# Patient Record
Sex: Female | Born: 1955 | Race: White | Hispanic: No | Marital: Married | Smoking: Never smoker
Health system: Southern US, Community
[De-identification: ages and names within clinical notes are randomized; demographics above are authoritative.]

## PROBLEM LIST (undated history)

## (undated) DIAGNOSIS — K219 Gastro-esophageal reflux disease without esophagitis: Secondary | ICD-10-CM

## (undated) DIAGNOSIS — F419 Anxiety disorder, unspecified: Secondary | ICD-10-CM

## (undated) HISTORY — PX: AUGMENTATION MAMMAPLASTY: SUR837

---

## 1999-11-07 ENCOUNTER — Other Ambulatory Visit: Admission: RE | Admit: 1999-11-07 | Discharge: 1999-11-07 | Payer: Self-pay | Admitting: Obstetrics and Gynecology

## 2001-08-18 ENCOUNTER — Other Ambulatory Visit: Admission: RE | Admit: 2001-08-18 | Discharge: 2001-08-18 | Payer: Self-pay | Admitting: Obstetrics and Gynecology

## 2003-02-04 ENCOUNTER — Other Ambulatory Visit: Admission: RE | Admit: 2003-02-04 | Discharge: 2003-02-04 | Payer: Self-pay | Admitting: *Deleted

## 2004-03-06 ENCOUNTER — Other Ambulatory Visit: Admission: RE | Admit: 2004-03-06 | Discharge: 2004-03-06 | Payer: Self-pay | Admitting: *Deleted

## 2009-09-12 ENCOUNTER — Ambulatory Visit: Payer: Self-pay | Admitting: Radiology

## 2009-09-12 ENCOUNTER — Ambulatory Visit (HOSPITAL_BASED_OUTPATIENT_CLINIC_OR_DEPARTMENT_OTHER): Admission: RE | Admit: 2009-09-12 | Discharge: 2009-09-12 | Payer: Self-pay | Admitting: Obstetrics & Gynecology

## 2010-01-28 ENCOUNTER — Emergency Department (HOSPITAL_BASED_OUTPATIENT_CLINIC_OR_DEPARTMENT_OTHER): Admission: EM | Admit: 2010-01-28 | Discharge: 2010-01-28 | Payer: Self-pay | Admitting: Emergency Medicine

## 2010-01-29 ENCOUNTER — Emergency Department (HOSPITAL_BASED_OUTPATIENT_CLINIC_OR_DEPARTMENT_OTHER): Admission: EM | Admit: 2010-01-29 | Discharge: 2010-01-29 | Payer: Self-pay | Admitting: Emergency Medicine

## 2010-01-29 ENCOUNTER — Ambulatory Visit: Payer: Self-pay | Admitting: Diagnostic Radiology

## 2011-02-18 LAB — URINE CULTURE
Colony Count: >=100000
Colony Count: NO GROWTH

## 2011-02-18 LAB — CBC
HCT: 34.2 % — ABNORMAL LOW (ref 36.0–46.0)
Hemoglobin: 12.1 g/dL (ref 12.0–15.0)
MCV: 90.3 fL (ref 78.0–100.0)

## 2011-02-18 LAB — BASIC METABOLIC PANEL
Calcium: 9.4 mg/dL (ref 8.4–10.5)
Creatinine, Ser: 0.8 mg/dL (ref 0.4–1.2)
GFR calc Af Amer: >60 mL/min (ref >60)
GFR calc non Af Amer: >60 mL/min (ref >60)
Glucose, Bld: 123 mg/dL — ABNORMAL HIGH (ref 70–99)

## 2011-02-18 LAB — URINALYSIS, ROUTINE W REFLEX MICROSCOPIC
Bilirubin Urine: NEGATIVE
Ketones, ur: 15 mg/dL — AB
Protein, ur: 100 mg/dL — AB
Protein, ur: 30 mg/dL — AB
Urobilinogen, UA: 0.2 mg/dL (ref 0.0–1.0)
pH: 6 (ref 5.0–8.0)

## 2011-02-18 LAB — DIFFERENTIAL
Basophils Relative: 0 % (ref 0–1)
Eosinophils Absolute: 0 10*3/uL (ref 0.0–0.7)
Eosinophils Relative: 0 % (ref 0–5)
Lymphocytes Relative: 2 % — ABNORMAL LOW (ref 12–46)
Neutro Abs: 29.6 10*3/uL — ABNORMAL HIGH (ref 1.7–7.7)

## 2011-02-18 LAB — URINE MICROSCOPIC-ADD ON

## 2011-02-28 ENCOUNTER — Other Ambulatory Visit (HOSPITAL_BASED_OUTPATIENT_CLINIC_OR_DEPARTMENT_OTHER): Payer: Self-pay | Admitting: Obstetrics & Gynecology

## 2011-02-28 DIAGNOSIS — Z1231 Encounter for screening mammogram for malignant neoplasm of breast: Secondary | ICD-10-CM

## 2011-03-12 ENCOUNTER — Ambulatory Visit (HOSPITAL_BASED_OUTPATIENT_CLINIC_OR_DEPARTMENT_OTHER)
Admission: RE | Admit: 2011-03-12 | Discharge: 2011-03-12 | Disposition: A | Discharge status: Home / Self Care | Payer: BLUE CROSS/BLUE SHIELD | Source: Ambulatory Visit | Attending: Obstetrics & Gynecology | Admitting: Obstetrics & Gynecology

## 2011-03-12 ENCOUNTER — Other Ambulatory Visit (HOSPITAL_BASED_OUTPATIENT_CLINIC_OR_DEPARTMENT_OTHER): Payer: Self-pay | Admitting: Obstetrics & Gynecology

## 2011-03-12 DIAGNOSIS — Z1231 Encounter for screening mammogram for malignant neoplasm of breast: Secondary | ICD-10-CM

## 2012-05-27 ENCOUNTER — Other Ambulatory Visit (HOSPITAL_BASED_OUTPATIENT_CLINIC_OR_DEPARTMENT_OTHER): Payer: Self-pay | Admitting: Obstetrics & Gynecology

## 2012-05-27 DIAGNOSIS — Z1231 Encounter for screening mammogram for malignant neoplasm of breast: Secondary | ICD-10-CM

## 2012-06-03 ENCOUNTER — Ambulatory Visit (HOSPITAL_BASED_OUTPATIENT_CLINIC_OR_DEPARTMENT_OTHER): Payer: BLUE CROSS/BLUE SHIELD

## 2012-06-03 ENCOUNTER — Inpatient Hospital Stay (HOSPITAL_BASED_OUTPATIENT_CLINIC_OR_DEPARTMENT_OTHER): Admission: RE | Admit: 2012-06-03 | Payer: BLUE CROSS/BLUE SHIELD | Source: Ambulatory Visit

## 2012-06-10 ENCOUNTER — Ambulatory Visit (HOSPITAL_BASED_OUTPATIENT_CLINIC_OR_DEPARTMENT_OTHER): Payer: BLUE CROSS/BLUE SHIELD

## 2012-07-01 ENCOUNTER — Ambulatory Visit (HOSPITAL_BASED_OUTPATIENT_CLINIC_OR_DEPARTMENT_OTHER)
Admission: RE | Admit: 2012-07-01 | Discharge: 2012-07-01 | Disposition: A | Discharge status: Home / Self Care | Payer: No Typology Code available for payment source | Source: Ambulatory Visit | Attending: Obstetrics & Gynecology | Admitting: Obstetrics & Gynecology

## 2012-07-01 ENCOUNTER — Inpatient Hospital Stay (HOSPITAL_BASED_OUTPATIENT_CLINIC_OR_DEPARTMENT_OTHER): Admission: RE | Admit: 2012-07-01 | Payer: BLUE CROSS/BLUE SHIELD | Source: Ambulatory Visit

## 2012-07-01 DIAGNOSIS — Z1231 Encounter for screening mammogram for malignant neoplasm of breast: Secondary | ICD-10-CM | POA: Insufficient documentation

## 2013-12-29 ENCOUNTER — Other Ambulatory Visit (HOSPITAL_BASED_OUTPATIENT_CLINIC_OR_DEPARTMENT_OTHER): Payer: Self-pay | Admitting: Obstetrics & Gynecology

## 2013-12-29 DIAGNOSIS — Z1231 Encounter for screening mammogram for malignant neoplasm of breast: Secondary | ICD-10-CM

## 2014-01-12 ENCOUNTER — Ambulatory Visit (HOSPITAL_BASED_OUTPATIENT_CLINIC_OR_DEPARTMENT_OTHER)
Admission: RE | Admit: 2014-01-12 | Discharge: 2014-01-12 | Disposition: A | Discharge status: Home / Self Care | Payer: BC Managed Care – PPO | Source: Ambulatory Visit | Attending: Obstetrics & Gynecology | Admitting: Obstetrics & Gynecology

## 2014-01-12 DIAGNOSIS — Z1231 Encounter for screening mammogram for malignant neoplasm of breast: Secondary | ICD-10-CM | POA: Insufficient documentation

## 2015-02-09 ENCOUNTER — Emergency Department (HOSPITAL_COMMUNITY): Payer: BLUE CROSS/BLUE SHIELD

## 2015-02-09 ENCOUNTER — Observation Stay (HOSPITAL_COMMUNITY)
Admission: EM | Admit: 2015-02-09 | Discharge: 2015-02-10 | Disposition: A | Discharge status: Home / Self Care | Payer: BLUE CROSS/BLUE SHIELD | Attending: Internal Medicine | Admitting: Internal Medicine

## 2015-02-09 ENCOUNTER — Encounter (HOSPITAL_COMMUNITY): Payer: Self-pay | Admitting: Emergency Medicine

## 2015-02-09 DIAGNOSIS — R9431 Abnormal electrocardiogram [ECG] [EKG]: Secondary | ICD-10-CM | POA: Diagnosis not present

## 2015-02-09 DIAGNOSIS — F329 Major depressive disorder, single episode, unspecified: Secondary | ICD-10-CM | POA: Diagnosis not present

## 2015-02-09 DIAGNOSIS — E785 Hyperlipidemia, unspecified: Secondary | ICD-10-CM | POA: Diagnosis not present

## 2015-02-09 DIAGNOSIS — K219 Gastro-esophageal reflux disease without esophagitis: Secondary | ICD-10-CM | POA: Diagnosis not present

## 2015-02-09 DIAGNOSIS — R030 Elevated blood-pressure reading, without diagnosis of hypertension: Secondary | ICD-10-CM | POA: Diagnosis not present

## 2015-02-09 DIAGNOSIS — Z79899 Other long term (current) drug therapy: Secondary | ICD-10-CM | POA: Diagnosis not present

## 2015-02-09 DIAGNOSIS — R202 Paresthesia of skin: Secondary | ICD-10-CM | POA: Insufficient documentation

## 2015-02-09 DIAGNOSIS — R200 Anesthesia of skin: Secondary | ICD-10-CM | POA: Diagnosis present

## 2015-02-09 DIAGNOSIS — F419 Anxiety disorder, unspecified: Secondary | ICD-10-CM | POA: Diagnosis not present

## 2015-02-09 DIAGNOSIS — R079 Chest pain, unspecified: Principal | ICD-10-CM | POA: Diagnosis present

## 2015-02-09 DIAGNOSIS — IMO0001 Reserved for inherently not codable concepts without codable children: Secondary | ICD-10-CM

## 2015-02-09 HISTORY — DX: Gastro-esophageal reflux disease without esophagitis: K21.9

## 2015-02-09 HISTORY — DX: Anxiety disorder, unspecified: F41.9

## 2015-02-09 LAB — BASIC METABOLIC PANEL
ANION GAP: 9 (ref 5–15)
BUN: 10 mg/dL (ref 6–23)
CHLORIDE: 105 mmol/L (ref 96–112)
CO2: 26 mmol/L (ref 19–32)
Calcium: 9.4 mg/dL (ref 8.4–10.5)
Creatinine, Ser: 0.72 mg/dL (ref 0.50–1.10)
GFR calc Af Amer: >90 mL/min (ref >90)
GLUCOSE: 92 mg/dL (ref 70–99)
POTASSIUM: 3.7 mmol/L (ref 3.5–5.1)
SODIUM: 140 mmol/L (ref 135–145)

## 2015-02-09 LAB — CBC
HEMATOCRIT: 39 % (ref 36.0–46.0)
Hemoglobin: 12.9 g/dL (ref 12.0–15.0)
MCH: 30 pg (ref 26.0–34.0)
MCHC: 33.1 g/dL (ref 30.0–36.0)
MCV: 90.7 fL (ref 78.0–100.0)
Platelets: 249 10*3/uL (ref 150–400)
RBC: 4.3 MIL/uL (ref 3.87–5.11)
RDW: 12.2 % (ref 11.5–15.5)
WBC: 7 10*3/uL (ref 4.0–10.5)

## 2015-02-09 LAB — I-STAT TROPONIN, ED: TROPONIN I, POC: 0 ng/mL (ref 0.00–0.08)

## 2015-02-09 MED ORDER — NITROGLYCERIN 0.4 MG SL SUBL
0.4000 mg | SUBLINGUAL_TABLET | SUBLINGUAL | Status: DC | PRN
  Administered 2015-02-09: 0.4 mg via SUBLINGUAL
  Filled 2015-02-09: qty 1

## 2015-02-09 MED ORDER — ASPIRIN 81 MG PO CHEW
324.0000 mg | CHEWABLE_TABLET | Freq: Once | ORAL | Status: AC
  Administered 2015-02-09: 324 mg via ORAL
  Filled 2015-02-09: qty 4

## 2015-02-09 MED ORDER — MORPHINE SULFATE 4 MG/ML IJ SOLN
4.0000 mg | Freq: Once | INTRAMUSCULAR | Status: DC
  Filled 2015-02-09: qty 1

## 2015-02-09 MED ORDER — GI COCKTAIL ~~LOC~~
30.0000 mL | Freq: Once | ORAL | Status: AC
  Administered 2015-02-09: 30 mL via ORAL
  Filled 2015-02-09: qty 30

## 2015-02-09 NOTE — ED Provider Notes (Signed)
CSN: [id number]     Arrival date & time 02/09/15  1307 History   First MD Initiated Contact with Patient 02/09/15 1727     Chief Complaint  Patient presents with  . Chest Pain  . Anxiety     (Consider location/radiation/quality/duration/timing/severity/associated sxs/prior Treatment) Patient is a 59 y.o. female presenting with chest pain. The history is provided by the patient.  Chest Pain Pain location:  Substernal area Pain quality: pressure   Pain radiates to:  L arm Pain radiates to the back: no   Pain severity:  Moderate Onset quality:  Gradual Duration:  3 days Timing:  Intermittent Progression:  Worsening Chronicity:  New Context: at rest   Relieved by:  Nothing Exacerbated by: emotional upset. Associated symptoms: no altered mental status, no back pain, no cough, no fever, no nausea, no shortness of breath and not vomiting     Past Medical History  Diagnosis Date  . GERD (gastroesophageal reflux disease)   . Anxiety    History reviewed. No pertinent past surgical history. History reviewed. No pertinent family history. History  Substance Use Topics  . Smoking status: Never Smoker   . Smokeless tobacco: Not on file  . Alcohol Use: 0.6 oz/week    1 Glasses of wine per week   OB History    No data available     Review of Systems  Constitutional: Negative for fever.  Respiratory: Negative for cough and shortness of breath.   Cardiovascular: Positive for chest pain.  Gastrointestinal: Negative for nausea and vomiting.  Musculoskeletal: Negative for back pain.  All other systems reviewed and are negative.     Allergies  Sulfa antibiotics  Home Medications   Prior to Admission medications   Medication Sig Start Date End Date Taking? Authorizing Provider  atorvastatin (LIPITOR) 20 MG tablet Take 20 mg by mouth daily. 11/29/14  Yes Historical Provider, MD  Cholecalciferol (VITAMIN D-3 PO) Take by mouth.   Yes Historical Provider, MD  escitalopram  (LEXAPRO) 10 MG tablet Take 10 mg by mouth daily. 01/26/15  Yes Historical Provider, MD  EVENING PRIMROSE OIL PO Take by mouth.   Yes Historical Provider, MD  OVER THE COUNTER MEDICATION "Thyroid supplement."   Yes Historical Provider, MD  OVER THE COUNTER MEDICATION "Hormone cream."   Yes Historical Provider, MD  OVER THE COUNTER MEDICATION "Acid reflux supplement."   Yes Historical Provider, MD  PANTOTHENIC ACID PO Take by mouth. "Vitamin B-5."   Yes Historical Provider, MD  pantoprazole (PROTONIX) 40 MG tablet Take 1 tablet (40 mg total) by mouth daily. 02/10/15   Alison Murray, MD   BP 125/75 mmHg  Pulse 56  Temp(Src) 98 F (36.7 C) (Oral)  Resp 18  Ht  (1.575 m)  Wt 117 lb (53.071 kg)  BMI 21.39 kg/m2  SpO2 98% Physical Exam  Constitutional: She is oriented to person, place, and time. She appears well-developed and well-nourished. No distress.  HENT:  Head: Normocephalic and atraumatic.  Mouth/Throat: Oropharynx is clear and moist.  Eyes: EOM are normal. Pupils are equal, round, and reactive to light.  Neck: Normal range of motion. Neck supple.  Cardiovascular: Normal rate and regular rhythm.  Exam reveals no friction rub.   No murmur heard. Pulmonary/Chest: Effort normal and breath sounds normal. No respiratory distress. She has no wheezes. She has no rales.  Abdominal: Soft. She exhibits no distension. There is no tenderness. There is no rebound.  Musculoskeletal: Normal range of motion. She exhibits no edema.  Neurological: She is alert and oriented to person, place, and time.  Skin: No rash noted. She is not diaphoretic.  Nursing note and vitals reviewed.   ED Course  Procedures (including critical care time) Labs Review Labs Reviewed  CBC  BASIC METABOLIC PANEL  LIPID PANEL  TROPONIN I  TSH  VITAMIN B12  TROPONIN I  CBC  CREATININE, SERUM  I-STAT TROPOININ, ED    Imaging Review Dg Chest 2 View (if Patient Has Fever And/or Copd)  02/09/2015   CLINICAL  DATA:  Worsening tightness in chest for 3 days, history GERD  EXAM: CHEST  2 VIEW  COMPARISON:  04/26/2012  FINDINGS: Normal heart size, mediastinal contours, and pulmonary vascularity.  Lungs hyperinflated but clear.  Minimal central peribronchial thickening.  No infiltrate, pleural effusion or pneumothorax.  BILATERAL breast prostheses.  Bones demineralized.  IMPRESSION: Hyperinflated lungs with minimal peribronchial thickening.  No acute infiltrate.   Electronically Signed   By: Ulyses SouthwardMark  Boles M.D.   On: 02/09/2015 14:14    EKG Interpretation  Date/Time:  Thursday February 09 2015 18:06:58 EDT Ventricular Rate:  62 PR Interval:  140 QRS Duration: 82 QT Interval:  467 QTC Calculation: 474 R Axis:   56 Text Interpretation:  Sinus rhythm Borderline T abnormalities, anterior leads Flipped T waves V1-V2 Confirmed by Gwendolyn GrantWALDEN  MD, Ragena Fiola ([id number]) on 02/09/2015 6:10:10 PM        EKG Interpretation   Date/Time:  Thursday February 09 2015 13:15:52 EDT Ventricular Rate:  80 PR Interval:  136 QRS Duration: 78 QT Interval:  405 QTC Calculation: 467 R Axis:   59 Text Interpretation:  Sinus rhythm Anteroseptal infarct, age indeterminate  Baseline wander in lead(s) I V1 No prior for comparison Confirmed by  Gwendolyn GrantWALDEN  MD, Ellinore Merced ([id number]) on 02/09/2015 5:28:01 PM      MDM   Final diagnoses:  Chest pain, unspecified chest pain type    32F presents with chest tightness, discomfort for the past 3 days. Intermittent. Pressure like, radiates to her L arm. Worse with emotional upset, no alleviating factors. Initially began as a epigastric burning sensation, however changed into more of a pressure like pain. Happens for as long as hours, alleviates on its own.  AFVSS here. Has 1-2/10 chest pressure. Initial EKG with some nonspecific changes in V1-V2, will repeat. ASA and NTG given. Initial troponin normal. Will give GI cocktail. Pain without improvement, still mildly present. With flipped T waves, will admit. I  spoke with Cards on-call, recommended hospitalist admission since she's low-risk. I agree with this plan. Admitted to hospitalist. Repeat EKG with flipped T waves anteriorly in V1-V2, better visualized on 2nd EKG.  Elwin MochaBlair Kemora Pinard, MD 02/10/15 1236

## 2015-02-09 NOTE — ED Notes (Signed)
Pt c/o chest tightness and discomfort in central chest. Pt c/o acid reflux x 3 days that won't go away, pt states she feels she is having bad anxiety attack. Pt denies SOB.

## 2015-02-10 DIAGNOSIS — F419 Anxiety disorder, unspecified: Secondary | ICD-10-CM | POA: Diagnosis not present

## 2015-02-10 DIAGNOSIS — R0789 Other chest pain: Secondary | ICD-10-CM

## 2015-02-10 DIAGNOSIS — K219 Gastro-esophageal reflux disease without esophagitis: Secondary | ICD-10-CM | POA: Diagnosis not present

## 2015-02-10 DIAGNOSIS — R079 Chest pain, unspecified: Secondary | ICD-10-CM | POA: Diagnosis not present

## 2015-02-10 DIAGNOSIS — E785 Hyperlipidemia, unspecified: Secondary | ICD-10-CM | POA: Diagnosis not present

## 2015-02-10 LAB — LIPID PANEL
CHOLESTEROL: 155 mg/dL (ref 0–200)
HDL: 46 mg/dL (ref >39)
LDL Cholesterol: 95 mg/dL (ref 0–99)
Total CHOL/HDL Ratio: 3.4 RATIO
Triglycerides: 69 mg/dL (ref <150)
VLDL: 14 mg/dL (ref 0–40)

## 2015-02-10 LAB — TROPONIN I

## 2015-02-10 MED ORDER — HEPARIN SODIUM (PORCINE) 5000 UNIT/ML IJ SOLN
5000.0000 [IU] | Freq: Three times a day (TID) | INTRAMUSCULAR | Status: DC
  Administered 2015-02-10: 5000 [IU] via SUBCUTANEOUS
  Filled 2015-02-10: qty 1

## 2015-02-10 MED ORDER — GI COCKTAIL ~~LOC~~
30.0000 mL | Freq: Four times a day (QID) | ORAL | Status: DC | PRN
  Administered 2015-02-10: 30 mL via ORAL
  Filled 2015-02-10: qty 30

## 2015-02-10 MED ORDER — ACETAMINOPHEN 325 MG PO TABS: 650.0000 mg | ORAL_TABLET | ORAL | Status: DC | PRN

## 2015-02-10 MED ORDER — ALPRAZOLAM 0.25 MG PO TABS: 0.2500 mg | ORAL_TABLET | Freq: Two times a day (BID) | ORAL | Status: DC | PRN

## 2015-02-10 MED ORDER — ESCITALOPRAM OXALATE 10 MG PO TABS
10.0000 mg | ORAL_TABLET | Freq: Every day | ORAL | Status: DC
Start: 2015-02-10 — End: 2015-02-10
  Administered 2015-02-10: 10 mg via ORAL
  Filled 2015-02-10: qty 1

## 2015-02-10 MED ORDER — METOPROLOL TARTRATE 25 MG PO TABS
12.5000 mg | ORAL_TABLET | Freq: Two times a day (BID) | ORAL | Status: DC
  Filled 2015-02-10: qty 1

## 2015-02-10 MED ORDER — SODIUM CHLORIDE 0.9 % IV SOLN: INTRAVENOUS | Status: DC

## 2015-02-10 MED ORDER — PANTOPRAZOLE SODIUM 40 MG PO TBEC
40.0000 mg | DELAYED_RELEASE_TABLET | Freq: Two times a day (BID) | ORAL | Status: DC
  Administered 2015-02-10: 40 mg via ORAL
  Filled 2015-02-10: qty 1

## 2015-02-10 MED ORDER — PANTOPRAZOLE SODIUM 40 MG PO TBEC: 40.0000 mg | DELAYED_RELEASE_TABLET | Freq: Every day | ORAL | Status: DC

## 2015-02-10 MED ORDER — MORPHINE SULFATE 2 MG/ML IJ SOLN: 2.0000 mg | INTRAMUSCULAR | Status: DC | PRN

## 2015-02-10 MED ORDER — ASPIRIN EC 81 MG PO TBEC
81.0000 mg | DELAYED_RELEASE_TABLET | Freq: Every day | ORAL | Status: DC
  Administered 2015-02-10: 81 mg via ORAL
  Filled 2015-02-10: qty 1

## 2015-02-10 MED ORDER — ATORVASTATIN CALCIUM 10 MG PO TABS: 20.0000 mg | ORAL_TABLET | Freq: Every day | ORAL | Status: DC

## 2015-02-10 MED ORDER — SUCRALFATE 1 GM/10ML PO SUSP
1.0000 g | Freq: Three times a day (TID) | ORAL | Status: DC
  Administered 2015-02-10: 1 g via ORAL
  Filled 2015-02-10: qty 10

## 2015-02-10 MED ORDER — ONDANSETRON HCL 4 MG/2ML IJ SOLN: 4.0000 mg | Freq: Four times a day (QID) | INTRAMUSCULAR | Status: DC | PRN

## 2015-02-10 NOTE — Discharge Summary (Addendum)
Physician Discharge Summary  Janet James ZOX:[id number] DOB: [birth year] DOA: 02/09/2015  PCP: No primary care provider on file.  Admit date: 02/09/2015 Discharge date: 02/10/2015  Recommendations for Outpatient Follow-up:  1. Follow-up with primary care physician per scheduled appointment. 2.   New medication is Protonix 40 mg daily.  Discharge Diagnoses:  Active Problems:   Chest pain   GERD (gastroesophageal reflux disease)   Anxiety   Numbness and tingling in left hand   HLD (hyperlipidemia)   Elevated BP    Discharge Condition: stable   Diet recommendation: as tolerated   History of present illness:  59 year old female with past medical history of acid reflux, dyslipidemia who presented to [facility] for evaluation of intermittent chest pain, feeling like burning sensation especially after eating. Patient reports the burning sensation sometimes radiates up into the throat area. No shortness of breath or palpitations. No abdominal pain, nausea or vomiting. Patient takes over-the-counter acid reflux medication. This has not provided significant symptomatic relief.  On the admission, 12-lead EKG showed sinus rhythm. Troponin level was within normal limits. Unremarkable blood work.   Hospital Course:   Active Problems:   Chest pain - Likely of GI etiology, acid reflux. - Patient feels better this morning. We will continue Protonix on discharge. - 12-lead EKG showed sinus rhythm. The troponin levels were within normal limits. - Chest x-ray no acute findings.    GERD (gastroesophageal reflux disease) - As mentioned, continue Protonix on discharge    HLD (hyperlipidemia) - Continue Lipitor on discharge as per prior home regimen    Anxiety - Continue home medications  Signed:  Manson Passey, MD  [facility] 02/10/2015, 9:21 AM  Pager #: [phone]  Procedures:  None   Consultations:  None   Discharge Exam: Filed Vitals:   02/10/15 0849   BP: 125/66  Pulse: 60  Temp:   Resp: 18   Filed Vitals:   02/09/15 2123 02/09/15 2142 02/10/15 0536 02/10/15 0849  BP: 139/68 153/70 129/48 125/66  Pulse: 63 60 63 60  Temp:  98.2 F (36.8 C) 98 F (36.7 C)   TempSrc:  Oral Oral   Resp: Height:   (1.575 m)    Weight:  53.071 kg (117 lb)    SpO2: 100% 100% 98% 98%    General: Pt is alert, follows commands appropriately, not in acute distress Cardiovascular: Regular rate and rhythm, S1/S2 +, no murmurs Respiratory: Clear to auscultation bilaterally, no wheezing, no crackles, no rhonchi Abdominal: Soft, non tender, non distended, bowel sounds +, no guarding Extremities: no edema, no cyanosis, pulses palpable bilaterally DP and PT Neuro: Grossly nonfocal  Discharge Instructions  Discharge Instructions    Call MD for:  difficulty breathing, headache or visual disturbances    Complete by:  As directed      Call MD for:  persistant nausea and vomiting    Complete by:  As directed      Call MD for:  severe uncontrolled pain    Complete by:  As directed      Diet - low sodium heart healthy    Complete by:  As directed      Increase activity slowly    Complete by:  As directed             Medication List    TAKE these medications        atorvastatin 20 MG tablet  Commonly known as:  LIPITOR  Take 20  mg by mouth daily.     escitalopram 10 MG tablet  Commonly known as:  LEXAPRO  Take 10 mg by mouth daily.     EVENING PRIMROSE OIL PO  Take by mouth.     OVER THE COUNTER MEDICATION  "Thyroid supplement."     OVER THE COUNTER MEDICATION  "Hormone cream."     OVER THE COUNTER MEDICATION  "Acid reflux supplement."     pantoprazole 40 MG tablet  Commonly known as:  PROTONIX  Take 1 tablet (40 mg total) by mouth daily.     PANTOTHENIC ACID PO  Take by mouth. "Vitamin B-5."     VITAMIN D-3 PO  Take by mouth.          The results of significant diagnostics from this hospitalization  (including imaging, microbiology, ancillary and laboratory) are listed below for reference.    Significant Diagnostic Studies: Dg Chest 2 View (if Patient Has Fever And/or Copd)  02/09/2015   CLINICAL DATA:  Worsening tightness in chest for 3 days, history GERD  EXAM: CHEST  2 VIEW  COMPARISON:  04/26/2012  FINDINGS: Normal heart size, mediastinal contours, and pulmonary vascularity.  Lungs hyperinflated but clear.  Minimal central peribronchial thickening.  No infiltrate, pleural effusion or pneumothorax.  BILATERAL breast prostheses.  Bones demineralized.  IMPRESSION: Hyperinflated lungs with minimal peribronchial thickening.  No acute infiltrate.   Electronically Signed   By: Ulyses SouthwardMark  Boles M.D.   On: 02/09/2015 14:14    Microbiology: No results found for this or any previous visit (from the past 240 hour(s)).   Labs: Basic Metabolic Panel:  Recent Labs Lab 02/09/15 1319  NA 140  K 3.7  CL 105  CO2 26  GLUCOSE 92  BUN 10  CREATININE 0.72  CALCIUM 9.4   Liver Function Tests: No results for input(s): AST, ALT, ALKPHOS, BILITOT, PROT, ALBUMIN in the last 168 hours. No results for input(s): LIPASE, AMYLASE in the last 168 hours. No results for input(s): AMMONIA in the last 168 hours. CBC:  Recent Labs Lab 02/09/15 1319  WBC 7.0  HGB 12.9  HCT 39.0  MCV 90.7  PLT 249   Cardiac Enzymes:  Recent Labs Lab 02/10/15 0300  TROPONINI <0.03   BNP: BNP (last 3 results) No results for input(s): BNP in the last 8760 hours.  ProBNP (last 3 results) No results for input(s): PROBNP in the last 8760 hours.  CBG: No results for input(s): GLUCAP in the last 168 hours.  Time coordinating discharge: Over 30 minutes

## 2015-02-10 NOTE — Progress Notes (Signed)
Dr. Elisabeth Pigeonevine stated to D/C patient and D/C order for 2D-echo and that cardiology does not need to see patient.

## 2015-02-10 NOTE — Progress Notes (Signed)
Patient's heart rate in the 50s and lopressor due to be given, Dr. Elisabeth Pigeonevine notified and she D/C'd med. Will continue to assess patient.

## 2015-02-10 NOTE — Discharge Instructions (Signed)
Gastroesophageal Reflux Disease, Adult Gastroesophageal reflux disease (GERD) happens when acid from your stomach flows up into the esophagus. When acid comes in contact with the esophagus, the acid causes soreness (inflammation) in the esophagus. Over time, GERD may create small holes (ulcers) in the lining of the esophagus. CAUSES   Increased body weight. This puts pressure on the stomach, making acid rise from the stomach into the esophagus.  Smoking. This increases acid production in the stomach.  Drinking alcohol. This causes decreased pressure in the lower esophageal sphincter (valve or ring of muscle between the esophagus and stomach), allowing acid from the stomach into the esophagus.  Late evening meals and a full stomach. This increases pressure and acid production in the stomach.  A malformed lower esophageal sphincter. Sometimes, no cause is found. SYMPTOMS   Burning pain in the lower part of the mid-chest behind the breastbone and in the mid-stomach area. This may occur twice a week or more often.  Trouble swallowing.  Sore throat.  Dry cough.  Asthma-like symptoms including chest tightness, shortness of breath, or wheezing. DIAGNOSIS  Your caregiver may be able to diagnose GERD based on your symptoms. In some cases, X-rays and other tests may be done to check for complications or to check the condition of your stomach and esophagus. TREATMENT  Your caregiver may recommend over-the-counter or prescription medicines to help decrease acid production. Ask your caregiver before starting or adding any new medicines.  HOME CARE INSTRUCTIONS   Change the factors that you can control. Ask your caregiver for guidance concerning weight loss, quitting smoking, and alcohol consumption.  Avoid foods and drinks that make your symptoms worse, such as:  Caffeine or alcoholic drinks.  Chocolate.  Peppermint or mint flavorings.  Garlic and onions.  Spicy foods.  Citrus fruits,  such as oranges, lemons, or limes.  Tomato-based foods such as sauce, chili, salsa, and pizza.  Fried and fatty foods.  Avoid lying down for the 3 hours prior to your bedtime or prior to taking a nap.  Eat small, frequent meals instead of large meals.  Wear loose-fitting clothing. Do not wear anything tight around your waist that causes pressure on your stomach.  Raise the head of your bed 6 to 8 inches with wood blocks to help you sleep. Extra pillows will not help.  Only take over-the-counter or prescription medicines for pain, discomfort, or fever as directed by your caregiver.  Do not take aspirin, ibuprofen, or other nonsteroidal anti-inflammatory drugs (NSAIDs). SEEK IMMEDIATE MEDICAL CARE IF:   You have pain in your arms, neck, jaw, teeth, or back.  Your pain increases or changes in intensity or duration.  You develop nausea, vomiting, or sweating (diaphoresis).  You develop shortness of breath, or you faint.  Your vomit is green, yellow, black, or looks like coffee grounds or blood.  Your stool is red, bloody, or black. These symptoms could be signs of other problems, such as heart disease, gastric bleeding, or esophageal bleeding. MAKE SURE YOU:   Understand these instructions.  Will watch your condition.  Will get help right away if you are not doing well or get worse. Document Released: 08/21/2005 Document Revised: 02/03/2012 Document Reviewed: 05/31/2011 Exit[employer] Patient Information 2015 [employer]. This information is not intended to replace advice given to you by your health care provider. Make sure you discuss any questions you have with your health care provider.  

## 2015-02-10 NOTE — Progress Notes (Signed)
UR completed 

## 2015-02-10 NOTE — H&P (Signed)
[facility] History and Physical  Janet James ZOX:[id number] DOB: [birth year] DOA: 02/09/2015  Referring physician: Dr. Gwendolyn Grant PCP: No primary care provider on file.   Chief Complaint: chest discomfort, epigastric pain, nausea and abnormal EKG  HPI: Janet James is a 59 y.o. female with PMH significant for anxiety, GERD, HLD, and depression; who presented to ED with complains of mid sternal chest pain radiated to left chest. Patient reports symptoms has been present since Sunday (02/05/15); on/off, lasting up to an hour sometimes and no associated with fever, chills, diaphoresis, vomiting or palpitations. Patient endorses some nausea and sour taste intermittently. She endorses currently not taking anything for GERD. Also, reports intermittent left hand numbness and tingling, not associated with chest/epigastric discomfort. In ED, patient troponin was neg, blood work unremarkable and EKG with flipped T waves (no old one for comparison). BP was also elevated. Patient heart score was 4 and TRH called for admission to r/o ACS.  Cardiology was called given abnormal EKG findings, but at this moment they felt IM can r/o ACS and if any abnormality found to re-consult in am.    Review of Systems:  Negative except as mentioned on HPI.  Past Medical History  Diagnosis Date  . GERD (gastroesophageal reflux disease)   . Anxiety    History reviewed. No pertinent past surgical history. Social History:  reports that she has never smoked. She does not have any smokeless tobacco history on file. She reports that she drinks about 0.6 oz of alcohol per week. Her drug history is not on file.  Allergies  Allergen Reactions  . Sulfa Antibiotics Rash    Family Hx: positive for heavy tobacco abuse in both parents, lung cancer in her father and COPD in her mother  Prior to Admission medications   Medication Sig Start Date End Date Taking? Authorizing Provider  atorvastatin (LIPITOR) 20 MG tablet Take  20 mg by mouth daily. 11/29/14  Yes Historical Provider, MD  Cholecalciferol (VITAMIN D-3 PO) Take by mouth.   Yes Historical Provider, MD  escitalopram (LEXAPRO) 10 MG tablet Take 10 mg by mouth daily. 01/26/15  Yes Historical Provider, MD  EVENING PRIMROSE OIL PO Take by mouth.   Yes Historical Provider, MD  OVER THE COUNTER MEDICATION "Thyroid supplement."   Yes Historical Provider, MD  OVER THE COUNTER MEDICATION "Hormone cream."   Yes Historical Provider, MD  OVER THE COUNTER MEDICATION "Acid reflux supplement."   Yes Historical Provider, MD  PANTOTHENIC ACID PO Take by mouth. "Vitamin B-5."   Yes Historical Provider, MD   Physical Exam: Filed Vitals:   02/09/15 1926 02/09/15 1936 02/09/15 2123 02/09/15 2142  BP: 156/71 142/72 139/68 153/70  Pulse: 60 63 63 60  Temp: 97.7 F (36.5 C)   98.2 F (36.8 C)  TempSrc: Oral   Oral  Resp: Height:     (1.575 m)  Weight:    53.071 kg (117 lb)  SpO2: 100%  100% 100%    Wt Readings from Last 3 Encounters:  02/09/15 53.071 kg (117 lb)    General:  Appears calm and comfortable, currently CP free; no SOB and complaints only of some epigastric burning discomfort  Eyes: PERRL, normal lids, irises & conjunctiva, no icterus, no nystagmus ENT: grossly normal hearing, lips & tongue, no erythema, no exudates, no drainage out of ears or nsotrils Neck: no LAD, masses or thyromegaly, no JVD Cardiovascular: RRR, no m/r/g. No LE edema. Telemetry: SR, no arrhythmias  Respiratory:  CTA bilaterally, no w/r/r. Normal respiratory effort. Abdomen: soft, mild epigastric discomfort, no guarding, no distension, positive BS Skin: no rash or induration seen on exam Musculoskeletal: grossly normal tone BUE/BLE, FROM Psychiatric: grossly normal mood and affect, speech fluent and appropriate Neurologic: grossly non-focal.          Labs on Admission:  Basic Metabolic Panel:  Recent Labs Lab 02/09/15 1319  NA 140  K 3.7  CL 105  CO2 26    GLUCOSE 92  BUN 10  CREATININE 0.72  CALCIUM 9.4   CBC:  Recent Labs Lab 02/09/15 1319  WBC 7.0  HGB 12.9  HCT 39.0  MCV 90.7  PLT 249   CBG: No results for input(s): GLUCAP in the last 168 hours.  Radiological Exams on Admission: Dg Chest 2 View (if Patient Has Fever And/or Copd)  02/09/2015   CLINICAL DATA:  Worsening tightness in chest for 3 days, history GERD  EXAM: CHEST  2 VIEW  COMPARISON:  04/26/2012  FINDINGS: Normal heart size, mediastinal contours, and pulmonary vascularity.  Lungs hyperinflated but clear.  Minimal central peribronchial thickening.  No infiltrate, pleural effusion or pneumothorax.  BILATERAL breast prostheses.  Bones demineralized.  IMPRESSION: Hyperinflated lungs with minimal peribronchial thickening.  No acute infiltrate.   Electronically Signed   By: Ulyses SouthwardMark  Boles M.D.   On: 02/09/2015 14:14    EKG:  Normal sinus rhythm, flipped T wave in anterior leads; no ST segments abnormalities   Assessment/Plan 1-Chest pain: atypical and most likely due to GERD. Given heart score of 4 and flipped T wave on EKG (no old one for comparison), patient would be admitted for ACS r/o -admit to telemetry -will start patient on ASA, statins and b-blocker -cycle EKG and troponin -will check 2-D echo -will also check lipid panel  -depending results will discussed/consult cardiology  2-GERD (gastroesophageal reflux disease): will start patient on PPI BID and carafate -PRN GI cocktail will also be available  3-Anxiety: will use PRN xanax  4-Numbness and tingling in left hand: will check B12 and TSH -suspect secondary to early stages of carpal tunnel   5-HLD (hyperlipidemia): will check lipid panel -continue statins  6-Elevated BP: no prior hx of HTN -will use low dose lopressor, which would help as tx for potential ACS -follow VS and adjust medications as needed -heart healthy diet ordered   Code Status: full code DVT Prophylaxis:heparin Family  Communication: no family at bedside Disposition Plan: observation, LOS < 2 midnights, telemetry bed  Time spent: 50 minutes  Vassie LollMadera, [name] [facility] Pager [phone]

## 2015-02-10 NOTE — Progress Notes (Signed)
Patient discharged home with husband, discharge instructions given and explained to patient and she verbalized understanding, wants to make her own appointment when she gets home.  Patient accompanied home by husband, transported to the car by staff via wheelchair. Skin intact, no wound noted.

## 2015-08-30 ENCOUNTER — Other Ambulatory Visit (HOSPITAL_BASED_OUTPATIENT_CLINIC_OR_DEPARTMENT_OTHER): Payer: Self-pay | Admitting: Emergency Medicine

## 2015-08-30 DIAGNOSIS — Z1231 Encounter for screening mammogram for malignant neoplasm of breast: Secondary | ICD-10-CM

## 2015-09-18 ENCOUNTER — Ambulatory Visit (HOSPITAL_BASED_OUTPATIENT_CLINIC_OR_DEPARTMENT_OTHER)
Admission: RE | Admit: 2015-09-18 | Discharge: 2015-09-18 | Disposition: A | Discharge status: Home / Self Care | Payer: BLUE CROSS/BLUE SHIELD | Source: Ambulatory Visit | Attending: Emergency Medicine | Admitting: Emergency Medicine

## 2015-09-18 DIAGNOSIS — Z1231 Encounter for screening mammogram for malignant neoplasm of breast: Secondary | ICD-10-CM | POA: Insufficient documentation

## 2017-08-12 ENCOUNTER — Other Ambulatory Visit (HOSPITAL_BASED_OUTPATIENT_CLINIC_OR_DEPARTMENT_OTHER): Payer: Self-pay | Admitting: *Deleted

## 2017-08-12 DIAGNOSIS — Z1231 Encounter for screening mammogram for malignant neoplasm of breast: Secondary | ICD-10-CM

## 2017-08-14 ENCOUNTER — Ambulatory Visit (HOSPITAL_BASED_OUTPATIENT_CLINIC_OR_DEPARTMENT_OTHER)
Admission: RE | Admit: 2017-08-14 | Discharge: 2017-08-14 | Disposition: A | Discharge status: Home / Self Care | Payer: Managed Care, Other (non HMO) | Source: Ambulatory Visit | Attending: Obstetrics & Gynecology | Admitting: Obstetrics & Gynecology

## 2017-08-14 ENCOUNTER — Other Ambulatory Visit: Payer: Self-pay | Admitting: Obstetrics & Gynecology

## 2017-08-14 ENCOUNTER — Encounter (HOSPITAL_BASED_OUTPATIENT_CLINIC_OR_DEPARTMENT_OTHER): Payer: Self-pay

## 2017-08-14 DIAGNOSIS — Z1231 Encounter for screening mammogram for malignant neoplasm of breast: Secondary | ICD-10-CM | POA: Insufficient documentation

## 2017-09-17 ENCOUNTER — Encounter: Payer: Self-pay | Admitting: Obstetrics & Gynecology

## 2017-09-17 ENCOUNTER — Ambulatory Visit (INDEPENDENT_AMBULATORY_CARE_PROVIDER_SITE_OTHER): Payer: BLUE CROSS/BLUE SHIELD | Admitting: Obstetrics & Gynecology

## 2017-09-17 VITALS — BP 160/90 | Ht 62.0 in | Wt 118.0 lb

## 2017-09-17 DIAGNOSIS — Z01419 Encounter for gynecological examination (general) (routine) without abnormal findings: Secondary | ICD-10-CM | POA: Diagnosis not present

## 2017-09-17 DIAGNOSIS — N952 Postmenopausal atrophic vaginitis: Secondary | ICD-10-CM

## 2017-09-17 DIAGNOSIS — Z780 Asymptomatic menopausal state: Secondary | ICD-10-CM | POA: Diagnosis not present

## 2017-09-17 DIAGNOSIS — Z23 Encounter for immunization: Secondary | ICD-10-CM | POA: Diagnosis not present

## 2017-09-17 DIAGNOSIS — Z1151 Encounter for screening for human papillomavirus (HPV): Secondary | ICD-10-CM | POA: Diagnosis not present

## 2017-09-17 NOTE — Addendum Note (Signed)
Addended by: Berna SpareASTILLO, BLANCA A on: 09/17/2017 11:17 AM   Modules accepted: Orders

## 2017-09-17 NOTE — Progress Notes (Signed)
Janet James January 01, 1956 [id number]   History:    61 y.o. G1P1 Married.  Adopted 2 of her Grand-children, who are 81 and 35 yo.  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  RP:  Established patient presenting for annual gyn exam   HPI:  Menopause.  No HRT.  No PMB.  No pelvic pain.  Sexually active, c/o pain with IC/dryness.  Breasts wnl.  Mictions/BMs wnl.  Not very active physically.  BMI 21.58.  Past medical history,surgical history, family history and social history were all reviewed and documented in the EPIC chart.  Gynecologic History No LMP recorded. Patient is postmenopausal. Contraception: post menopausal status Last Pap: 2016. Results were: normal Last mammogram: 07/2017. Results were: Negative Colono 6 yrs ago, 10 yr schedule No Bone Density for many years  Obstetric History OB History  Gravida Para Term Preterm AB Living  1 1       1   SAB TAB Ectopic Multiple Live Births               # Outcome Date GA Lbr Len/2nd Weight Sex Delivery Anes PTL Lv  1 Para                ROS: A ROS was performed and pertinent positives and negatives are included in the history.  GENERAL: No fevers or chills. HEENT: No change in vision, no earache, sore throat or sinus congestion. NECK: No pain or stiffness. CARDIOVASCULAR: No chest pain or pressure. No palpitations. PULMONARY: No shortness of breath, cough or wheeze. GASTROINTESTINAL: No abdominal pain, nausea, vomiting or diarrhea, melena or bright red blood per rectum. GENITOURINARY: No urinary frequency, urgency, hesitancy or dysuria. MUSCULOSKELETAL: No joint or muscle pain, no back pain, no recent trauma. DERMATOLOGIC: No rash, no itching, no lesions. ENDOCRINE: No polyuria, polydipsia, no heat or cold intolerance. No recent change in weight. HEMATOLOGICAL: No anemia or easy bruising or bleeding. NEUROLOGIC: No headache, seizures, numbness, tingling or weakness. PSYCHIATRIC: No  depression, no loss of interest in normal activity or change in sleep pattern.     Exam:   BP (!) 160/90   Ht 5\' 2"  (1.575 m)   Wt 118 lb (53.5 kg)   BMI 21.58 kg/m   Body mass index is 21.58 kg/m.  General appearance : Well developed well nourished female. No acute distress HEENT: Eyes: no retinal hemorrhage or exudates,  Neck supple, trachea midline, no carotid bruits, no thyroidmegaly Lungs: Clear to auscultation, no rhonchi or wheezes, or rib retractions  Heart: Regular rate and rhythm, no murmurs or gallops Breast:Examined in sitting and supine position were symmetrical in appearance, no palpable masses or tenderness,  no skin retraction, no nipple inversion, no nipple discharge, no skin discoloration, no axillary or supraclavicular lymphadenopathy Abdomen: no palpable masses or tenderness, no rebound or guarding Extremities: no edema or skin discoloration or tenderness  Pelvic: Vulva normal  Bartholin, Urethra, Skene Glands: Within normal limits             Vagina: No gross lesions or discharge  Cervix: No gross lesions or discharge.  Pap/HPV HR done.,  Uterus  AV, normal size, shape and consistency, non-tender and mobile  Adnexa  Without masses or tenderness  Anus and perineum  normal    Assessment/Plan:  61 y.o. female for annual exam   1. Encounter for routine gynecological examination with Papanicolaou smear of cervix Normal gyn exam except atrophic vaginitis.  Pap/HPV  HR done.  Breasts wnl.  Mammo neg 07/2017.  Colono neg 6 yrs ago.  2. Menopause present No HRT.  No PMB.  Well tolerated.  Vit D supplements/Ca++ in food/Weight bearing physical activity.  Schedule Bone Density here. - DG Bone Density; Future  3. Post-menopause atrophic vaginitis Symptomatic with superficial dyspareunia and dryness.  Local Estrogen treatment recommended.  Estring, Estradiol cream and Vagifem reviewed.  Will think about it.  Would probably prefer Vagifem tab vaginally, at bedtime every  night x 2 wks and then twice a week.  Will call back if wants a prescription.  In the meantime, Astroglide with intercourse and Replens moisturizer at any time for dryness as needed.  Genia DelMarie-Lyne Janet Crooke MD, 10:32 AM 09/17/2017

## 2017-09-17 NOTE — Patient Instructions (Signed)
1. Encounter for routine gynecological examination with Papanicolaou smear of cervix Normal gyn exam except atrophic vaginitis.  Pap/HPV HR done.  Breasts wnl.  Mammo neg 07/2017.  Colono neg 6 yrs ago.  2. Menopause present No HRT.  No PMB.  Well tolerated.  Vit D supplements/Ca++ in food/Weight bearing physical activity.  Schedule Bone Density here. - DG Bone Density; Future  3. Post-menopause atrophic vaginitis Symptomatic with superficial dyspareunia and dryness.  Local Estrogen treatment recommended.  Estring, Estradiol cream and Vagifem reviewed.  Will think about it.  Would probably prefer Vagifem tab vaginally, at bedtime every night x 2 wks and then twice a week.  Will call back if wants a prescription.  In the meantime, Astroglide with intercourse and Replens moisturizer at any time for dryness as needed.  Janet James, it was a pleasure seeing you today!  I will inform you of your results as soon as available.  Atrophic Vaginitis Atrophic vaginitis is a condition in which the tissues that line the vagina become dry and thin. This condition is most common in women who have stopped having regular menstrual periods (menopause). This usually starts when a woman is 77-13 years old. Estrogen helps to keep the vagina moist. It stimulates the vagina to produce a clear fluid that lubricates the vagina for sexual intercourse. This fluid also protects the vagina from infection. Lack of estrogen can cause the lining of the vagina to get thinner and dryer. The vagina may also shrink in size. It may become less elastic. Atrophic vaginitis tends to get worse over time as a woman's estrogen level drops. What are the causes? This condition is caused by the normal drop in estrogen that happens around the time of menopause. What increases the risk? Certain conditions or situations may lower a woman's estrogen level, which increases her risk of atrophic vaginitis. These include:  Taking medicine that blocks  estrogen.  Having ovaries removed surgically.  Being treated for cancer with X-ray treatment (radiation) or medicines (chemotherapy).  Exercising very hard and often.  Having an eating disorder (anorexia).  Giving birth or breastfeeding.  Being over the age of 73.  Smoking.  What are the signs or symptoms? Symptoms of this condition include:  Pain, soreness, or bleeding during sexual intercourse (dyspareunia).  Vaginal burning, irritation, or itching.  Pain or bleeding during a vaginal examination using a speculum (pelvic exam).  Loss of interest in sexual activity.  Having burning pain when passing urine.  Vaginal discharge that is brown or yellow.  In some cases, there are no symptoms. How is this diagnosed? This condition is diagnosed with a medical history and physical exam. This will include a pelvic exam that checks whether the inside of your vagina appears pale, thin, or dry. Rarely, you may also have other tests, including:  A urine test.  A test that checks the acid balance in your vaginal fluid (acid balance test).  How is this treated? Treatment for this condition may depend on the severity of your symptoms. Treatment may include:  Using an over-the-counter vaginal lubricant before you have sexual intercourse.  Using a long-acting vaginal moisturizer.  Using low-dose vaginal estrogen for moderate to severe symptoms that do not respond to other treatments. Options include creams, tablets, and inserts (vaginal rings). Before using vaginal estrogen, tell your health care provider if you have a history of: ? Breast cancer. ? Endometrial cancer. ? Blood clots.  Taking medicines. You may be able to take a daily pill for dyspareunia.  Discuss all of the risks of this medicine with your health care provider. It is usually not recommended for women who have a family history or personal history of breast cancer.  If your symptoms are very mild and you are not  sexually active, you may not need treatment. Follow these instructions at home:  Take medicines only as directed by your health care provider. Do not use herbal or alternative medicines unless your health care provider says that you can.  Use over-the-counter creams, lubricants, or moisturizers for dryness only as directed by your health care provider.  If your atrophic vaginitis is caused by menopause, discuss all of your menopausal symptoms and treatment options with your health care provider.  Do not douche.  Do not use products that can make your vagina dry. These include: ? Scented feminine sprays. ? Scented tampons. ? Scented soaps.  If it hurts to have sex, talk with your sexual partner. Contact a health care provider if:  Your discharge looks different than normal.  Your vagina has an unusual smell.  You have new symptoms.  Your symptoms do not improve with treatment.  Your symptoms get worse. This information is not intended to replace advice given to you by your health care provider. Make sure you discuss any questions you have with your health care provider. Document Released: 03/28/2015 Document Revised: 04/18/2016 Document Reviewed: 11/02/2014 Elsevier Interactive Patient Education  2018 [place] Maintenance for Postmenopausal Women Menopause is a normal process in which your reproductive ability comes to an end. This process happens gradually over a span of months to years, usually between the ages of 76 and 54. Menopause is complete when you have missed 12 consecutive menstrual periods. It is important to talk with your health care provider about some of the most common conditions that affect postmenopausal women, such as heart disease, cancer, and bone loss (osteoporosis). Adopting a healthy lifestyle and getting preventive care can help to promote your health and wellness. Those actions can also lower your chances of developing some of these common  conditions. What should I know about menopause? During menopause, you may experience a number of symptoms, such as:  Moderate-to-severe hot flashes.  Night sweats.  Decrease in sex drive.  Mood swings.  Headaches.  Tiredness.  Irritability.  Memory problems.  Insomnia.  Choosing to treat or not to treat menopausal changes is an individual decision that you make with your health care provider. What should I know about hormone replacement therapy and supplements? Hormone therapy products are effective for treating symptoms that are associated with menopause, such as hot flashes and night sweats. Hormone replacement carries certain risks, especially as you become older. If you are thinking about using estrogen or estrogen with progestin treatments, discuss the benefits and risks with your health care provider. What should I know about heart disease and stroke? Heart disease, heart attack, and stroke become more likely as you age. This may be due, in part, to the hormonal changes that your body experiences during menopause. These can affect how your body processes dietary fats, triglycerides, and cholesterol. Heart attack and stroke are both medical emergencies. There are many things that you can do to help prevent heart disease and stroke:  Have your blood pressure checked at least every 1-2 years. High blood pressure causes heart disease and increases the risk of stroke.  If you are 74-71 years old, ask your health care provider if you should take aspirin to prevent a heart attack  or a stroke.  Do not use any tobacco products, including cigarettes, chewing tobacco, or electronic cigarettes. If you need help quitting, ask your health care provider.  It is important to eat a healthy diet and maintain a healthy weight. ? Be sure to include plenty of vegetables, fruits, low-fat dairy products, and lean protein. ? Avoid eating foods that are high in solid fats, added sugars, or salt  (sodium).  Get regular exercise. This is one of the most important things that you can do for your health. ? Try to exercise for at least 150 minutes each week. The type of exercise that you do should increase your heart rate and make you sweat. This is known as moderate-intensity exercise. ? Try to do strengthening exercises at least twice each week. Do these in addition to the moderate-intensity exercise.  Know your numbers.Ask your health care provider to check your cholesterol and your blood glucose. Continue to have your blood tested as directed by your health care provider.  What should I know about cancer screening? There are several types of cancer. Take the following steps to reduce your risk and to catch any cancer development as early as possible. Breast Cancer  Practice breast self-awareness. ? This means understanding how your breasts normally appear and feel. ? It also means doing regular breast self-exams. Let your health care provider know about any changes, no matter how small.  If you are 108 or older, have a clinician do a breast exam (clinical breast exam or CBE) every year. Depending on your age, family history, and medical history, it may be recommended that you also have a yearly breast X-ray (mammogram).  If you have a family history of breast cancer, talk with your health care provider about genetic screening.  If you are at high risk for breast cancer, talk with your health care provider about having an MRI and a mammogram every year.  Breast cancer (BRCA) gene test is recommended for women who have family members with BRCA-related cancers. Results of the assessment will determine the need for genetic counseling and BRCA1 and for BRCA2 testing. BRCA-related cancers include these types: ? Breast. This occurs in males or females. ? Ovarian. ? Tubal. This may also be called fallopian tube cancer. ? Cancer of the abdominal or pelvic lining (peritoneal  cancer). ? Prostate. ? Pancreatic.  Cervical, Uterine, and Ovarian Cancer Your health care provider may recommend that you be screened regularly for cancer of the pelvic organs. These include your ovaries, uterus, and vagina. This screening involves a pelvic exam, which includes checking for microscopic changes to the surface of your cervix (Pap test).  For women ages 21-65, health care providers may recommend a pelvic exam and a Pap test every three years. For women ages 49-65, they may recommend the Pap test and pelvic exam, combined with testing for human papilloma virus (HPV), every five years. Some types of HPV increase your risk of cervical cancer. Testing for HPV may also be done on women of any age who have unclear Pap test results.  Other health care providers may not recommend any screening for nonpregnant women who are considered low risk for pelvic cancer and have no symptoms. Ask your health care provider if a screening pelvic exam is right for you.  If you have had past treatment for cervical cancer or a condition that could lead to cancer, you need Pap tests and screening for cancer for at least 20 years after your treatment. If Pap  tests have been discontinued for you, your risk factors (such as having a new sexual partner) need to be reassessed to determine if you should start having screenings again. Some women have medical problems that increase the chance of getting cervical cancer. In these cases, your health care provider may recommend that you have screening and Pap tests more often.  If you have a family history of uterine cancer or ovarian cancer, talk with your health care provider about genetic screening.  If you have vaginal bleeding after reaching menopause, tell your health care provider.  There are currently no reliable tests available to screen for ovarian cancer.  Lung Cancer Lung cancer screening is recommended for adults 56-72 years old who are at high risk for  lung cancer because of a history of smoking. A yearly low-dose CT scan of the lungs is recommended if you:  Currently smoke.  Have a history of at least 30 pack-years of smoking and you currently smoke or have quit within the past 15 years. A pack-year is smoking an average of one pack of cigarettes per day for one year.  Yearly screening should:  Continue until it has been 15 years since you quit.  Stop if you develop a health problem that would prevent you from having lung cancer treatment.  Colorectal Cancer  This type of cancer can be detected and can often be prevented.  Routine colorectal cancer screening usually begins at age 70 and continues through age 2.  If you have risk factors for colon cancer, your health care provider may recommend that you be screened at an earlier age.  If you have a family history of colorectal cancer, talk with your health care provider about genetic screening.  Your health care provider may also recommend using home test kits to check for hidden blood in your stool.  A small camera at the end of a tube can be used to examine your colon directly (sigmoidoscopy or colonoscopy). This is done to check for the earliest forms of colorectal cancer.  Direct examination of the colon should be repeated every 5-10 years until age 61. However, if early forms of precancerous polyps or small growths are found or if you have a family history or genetic risk for colorectal cancer, you may need to be screened more often.  Skin Cancer  Check your skin from head to toe regularly.  Monitor any moles. Be sure to tell your health care provider: ? About any new moles or changes in moles, especially if there is a change in a mole's shape or color. ? If you have a mole that is larger than the size of a pencil eraser.  If any of your family members has a history of skin cancer, especially at a young age, talk with your health care provider about genetic  screening.  Always use sunscreen. Apply sunscreen liberally and repeatedly throughout the day.  Whenever you are outside, protect yourself by wearing long sleeves, pants, a wide-brimmed hat, and sunglasses.  What should I know about osteoporosis? Osteoporosis is a condition in which bone destruction happens more quickly than new bone creation. After menopause, you may be at an increased risk for osteoporosis. To help prevent osteoporosis or the bone fractures that can happen because of osteoporosis, the following is recommended:  If you are 69-74 years old, get at least 1,000 mg of calcium and at least 600 mg of vitamin D per day.  If you are older than age 70 but younger  than age 1, get at least 1,200 mg of calcium and at least 600 mg of vitamin D per day.  If you are older than age 67, get at least 1,200 mg of calcium and at least 800 mg of vitamin D per day.  Smoking and excessive alcohol intake increase the risk of osteoporosis. Eat foods that are rich in calcium and vitamin D, and do weight-bearing exercises several times each week as directed by your health care provider. What should I know about how menopause affects my mental health? Depression may occur at any age, but it is more common as you become older. Common symptoms of depression include:  Low or sad mood.  Changes in sleep patterns.  Changes in appetite or eating patterns.  Feeling an overall lack of motivation or enjoyment of activities that you previously enjoyed.  Frequent crying spells.  Talk with your health care provider if you think that you are experiencing depression. What should I know about immunizations? It is important that you get and maintain your immunizations. These include:  Tetanus, diphtheria, and pertussis (Tdap) booster vaccine.  Influenza every year before the flu season begins.  Pneumonia vaccine.  Shingles vaccine.  Your health care provider may also recommend other  immunizations. This information is not intended to replace advice given to you by your health care provider. Make sure you discuss any questions you have with your health care provider. Document Released: 01/03/2006 Document Revised: 05/31/2016 Document Reviewed: 08/15/2015 Elsevier Interactive Patient Education  2018 [employer].

## 2017-09-19 LAB — PAP, TP IMAGING W/ HPV RNA, RFLX HPV TYPE 16,18/45: HPV DNA High Risk: NOT DETECTED

## 2019-01-08 ENCOUNTER — Other Ambulatory Visit (HOSPITAL_BASED_OUTPATIENT_CLINIC_OR_DEPARTMENT_OTHER): Payer: Self-pay | Admitting: *Deleted

## 2019-01-08 ENCOUNTER — Other Ambulatory Visit (HOSPITAL_BASED_OUTPATIENT_CLINIC_OR_DEPARTMENT_OTHER): Payer: Self-pay | Admitting: Internal Medicine

## 2019-01-08 DIAGNOSIS — Z1231 Encounter for screening mammogram for malignant neoplasm of breast: Secondary | ICD-10-CM

## 2019-01-18 ENCOUNTER — Ambulatory Visit (HOSPITAL_BASED_OUTPATIENT_CLINIC_OR_DEPARTMENT_OTHER)
Admission: RE | Admit: 2019-01-18 | Discharge: 2019-01-18 | Disposition: A | Discharge status: Home / Self Care | Payer: BLUE CROSS/BLUE SHIELD | Source: Ambulatory Visit | Attending: Internal Medicine | Admitting: Internal Medicine

## 2019-01-18 ENCOUNTER — Other Ambulatory Visit (HOSPITAL_BASED_OUTPATIENT_CLINIC_OR_DEPARTMENT_OTHER): Payer: Self-pay | Admitting: Internal Medicine

## 2019-01-18 ENCOUNTER — Encounter (HOSPITAL_BASED_OUTPATIENT_CLINIC_OR_DEPARTMENT_OTHER): Payer: Self-pay

## 2019-01-18 DIAGNOSIS — Z1231 Encounter for screening mammogram for malignant neoplasm of breast: Secondary | ICD-10-CM | POA: Diagnosis not present

## 2020-06-14 ENCOUNTER — Other Ambulatory Visit (HOSPITAL_BASED_OUTPATIENT_CLINIC_OR_DEPARTMENT_OTHER): Payer: Self-pay | Admitting: Internal Medicine

## 2020-06-14 DIAGNOSIS — Z1231 Encounter for screening mammogram for malignant neoplasm of breast: Secondary | ICD-10-CM

## 2020-06-21 ENCOUNTER — Ambulatory Visit (HOSPITAL_BASED_OUTPATIENT_CLINIC_OR_DEPARTMENT_OTHER)
Admission: RE | Admit: 2020-06-21 | Discharge: 2020-06-21 | Disposition: A | Discharge status: Home / Self Care | Payer: BC Managed Care – PPO | Source: Ambulatory Visit | Attending: Internal Medicine | Admitting: Internal Medicine

## 2020-06-21 ENCOUNTER — Other Ambulatory Visit: Payer: Self-pay

## 2020-06-21 DIAGNOSIS — Z1231 Encounter for screening mammogram for malignant neoplasm of breast: Secondary | ICD-10-CM

## 2020-08-21 ENCOUNTER — Encounter: Payer: Self-pay | Admitting: Obstetrics & Gynecology

## 2020-08-21 ENCOUNTER — Ambulatory Visit (INDEPENDENT_AMBULATORY_CARE_PROVIDER_SITE_OTHER): Payer: BC Managed Care – PPO | Admitting: Obstetrics & Gynecology

## 2020-08-21 ENCOUNTER — Other Ambulatory Visit: Payer: Self-pay

## 2020-08-21 VITALS — BP 152/88 | Ht 62.0 in | Wt 118.0 lb

## 2020-08-21 DIAGNOSIS — Z780 Asymptomatic menopausal state: Secondary | ICD-10-CM | POA: Diagnosis not present

## 2020-08-21 DIAGNOSIS — Z01419 Encounter for gynecological examination (general) (routine) without abnormal findings: Secondary | ICD-10-CM

## 2020-08-21 DIAGNOSIS — Z13820 Encounter for screening for osteoporosis: Secondary | ICD-10-CM | POA: Diagnosis not present

## 2020-08-21 NOTE — Progress Notes (Signed)
[facility] 12/26/1955 [id number]   History:    64 y.o. G1P1 Married.  Adopted 2 of her Grand-children, who are 1 and 86 yo.  (Grand-daughter is Bipolar)  RP:  Established patient presenting for annual gyn exam   HPI:  Menopause.  No HRT.  No PMB.  No pelvic pain.  Sexually active, c/o pain with IC/dryness.  Breasts wnl.  Mictions/BMs wnl.  Not very active physically.  BMI 21.58.   Past medical history,surgical history, family history and social history were all reviewed and documented in the EPIC chart.  Gynecologic History No LMP recorded. Patient is postmenopausal.  Obstetric History OB History  Gravida Para Term Preterm AB Living  1 1       1   SAB TAB Ectopic Multiple Live Births               # Outcome Date GA Lbr Len/2nd Weight Sex Delivery Anes PTL Lv  1 Para              ROS: A ROS was performed and pertinent positives and negatives are included in the history.  GENERAL: No fevers or chills. HEENT: No change in vision, no earache, sore throat or sinus congestion. NECK: No pain or stiffness. CARDIOVASCULAR: No chest pain or pressure. No palpitations. PULMONARY: No shortness of breath, cough or wheeze. GASTROINTESTINAL: No abdominal pain, nausea, vomiting or diarrhea, melena or bright red blood per rectum. GENITOURINARY: No urinary frequency, urgency, hesitancy or dysuria. MUSCULOSKELETAL: No joint or muscle pain, no back pain, no recent trauma. DERMATOLOGIC: No rash, no itching, no lesions. ENDOCRINE: No polyuria, polydipsia, no heat or cold intolerance. No recent change in weight. HEMATOLOGICAL: No anemia or easy bruising or bleeding. NEUROLOGIC: No headache, seizures, numbness, tingling or weakness. PSYCHIATRIC: No depression, no loss of interest in normal activity or change in sleep pattern.     Exam:   BP (!) 152/88 (BP Location: Right Arm, Patient Position: Sitting, Cuff Size: Normal)   Ht 5\' 2"  (1.575 m)   Wt 118 lb (53.5 kg)   BMI 21.58 kg/m   Body mass  index is 21.58 kg/m.  General appearance : Well developed well nourished female. No acute distress HEENT: Eyes: no retinal hemorrhage or exudates,  Neck supple, trachea midline, no carotid bruits, no thyroidmegaly Lungs: Clear to auscultation, no rhonchi or wheezes, or rib retractions  Heart: Regular rate and rhythm, no murmurs or gallops Breast:Examined in sitting and supine position were symmetrical in appearance, no palpable masses or tenderness,  no skin retraction, no nipple inversion, no nipple discharge, no skin discoloration, no axillary or supraclavicular lymphadenopathy Abdomen: no palpable masses or tenderness, no rebound or guarding Extremities: no edema or skin discoloration or tenderness  Pelvic: Vulva: Normal             Vagina: No gross lesions or discharge  Cervix: No gross lesions or discharge.  Pap reflex done.  Uterus  AV, normal size, shape and consistency, non-tender and mobile  Adnexa  Without masses or tenderness  Anus: Normal   Assessment/Plan:  64 y.o. female for annual exam   1. Encounter for routine gynecological examination with Papanicolaou smear of cervix Normal gynecologic exam in menopause.  Pap reflex done.  Breast exam normal.  Screening mammogram - July 2021.  Colonoscopy 5 years ago.  Health labs with family physician.  2. Postmenopause Postmenopausal, well on no hormone replacement therapy.  No postmenopausal bleeding.  3. Screening for osteoporosis Will schedule a bone density  here now.  Vitamin D supplements, calcium intake of 1500 mg daily and regular weightbearing physical activities. - DG Bone Density; Future  Genia Del MD, 3:56 PM 08/21/2020

## 2020-08-23 LAB — PAP IG W/ RFLX HPV ASCU

## 2020-08-24 ENCOUNTER — Encounter: Payer: Self-pay | Admitting: Obstetrics & Gynecology

## 2020-08-28 ENCOUNTER — Encounter: Payer: BLUE CROSS/BLUE SHIELD | Admitting: Obstetrics & Gynecology

## 2020-11-07 ENCOUNTER — Other Ambulatory Visit: Payer: Self-pay

## 2020-11-07 ENCOUNTER — Other Ambulatory Visit: Payer: Self-pay | Admitting: Obstetrics & Gynecology

## 2020-11-07 ENCOUNTER — Ambulatory Visit (INDEPENDENT_AMBULATORY_CARE_PROVIDER_SITE_OTHER): Payer: BC Managed Care – PPO

## 2020-11-07 DIAGNOSIS — M810 Age-related osteoporosis without current pathological fracture: Secondary | ICD-10-CM | POA: Diagnosis not present

## 2020-11-07 DIAGNOSIS — Z13820 Encounter for screening for osteoporosis: Secondary | ICD-10-CM

## 2020-11-07 DIAGNOSIS — Z780 Asymptomatic menopausal state: Secondary | ICD-10-CM

## 2021-09-07 ENCOUNTER — Other Ambulatory Visit (HOSPITAL_BASED_OUTPATIENT_CLINIC_OR_DEPARTMENT_OTHER): Payer: Self-pay | Admitting: Internal Medicine

## 2021-09-07 DIAGNOSIS — Z1231 Encounter for screening mammogram for malignant neoplasm of breast: Secondary | ICD-10-CM

## 2021-10-16 ENCOUNTER — Ambulatory Visit (HOSPITAL_BASED_OUTPATIENT_CLINIC_OR_DEPARTMENT_OTHER)
Admission: RE | Admit: 2021-10-16 | Discharge: 2021-10-16 | Disposition: A | Discharge status: Home / Self Care | Payer: BC Managed Care – PPO | Source: Ambulatory Visit | Attending: Internal Medicine | Admitting: Internal Medicine

## 2021-10-16 ENCOUNTER — Other Ambulatory Visit: Payer: Self-pay

## 2021-10-16 ENCOUNTER — Encounter (HOSPITAL_BASED_OUTPATIENT_CLINIC_OR_DEPARTMENT_OTHER): Payer: Self-pay

## 2021-10-16 DIAGNOSIS — Z1231 Encounter for screening mammogram for malignant neoplasm of breast: Secondary | ICD-10-CM | POA: Insufficient documentation

## 2023-01-03 ENCOUNTER — Other Ambulatory Visit (HOSPITAL_BASED_OUTPATIENT_CLINIC_OR_DEPARTMENT_OTHER): Payer: Self-pay | Admitting: Physician Assistant

## 2023-01-03 DIAGNOSIS — Z1239 Encounter for other screening for malignant neoplasm of breast: Secondary | ICD-10-CM

## 2023-01-07 ENCOUNTER — Encounter (HOSPITAL_BASED_OUTPATIENT_CLINIC_OR_DEPARTMENT_OTHER): Payer: Self-pay

## 2023-01-07 ENCOUNTER — Ambulatory Visit (HOSPITAL_BASED_OUTPATIENT_CLINIC_OR_DEPARTMENT_OTHER)
Admission: RE | Admit: 2023-01-07 | Discharge: 2023-01-07 | Disposition: A | Discharge status: Home / Self Care | Payer: Medicare HMO | Source: Ambulatory Visit | Attending: Physician Assistant | Admitting: Physician Assistant

## 2023-01-07 DIAGNOSIS — Z1231 Encounter for screening mammogram for malignant neoplasm of breast: Secondary | ICD-10-CM | POA: Diagnosis not present

## 2023-01-07 DIAGNOSIS — Z1239 Encounter for other screening for malignant neoplasm of breast: Secondary | ICD-10-CM | POA: Diagnosis present

## 2023-03-09 IMAGING — MG DIGITAL SCREENING BREAST BILAT IMPLANT W/ TOMO W/ CAD
9 of 12 series · 9 of 28 positions shown · non-contrast
Comparison: Previous exam(s).

CLINICAL DATA: Screening.

EXAM:
DIGITAL SCREENING BILATERAL MAMMOGRAM WITH IMPLANTS, CAD AND
TOMOSYNTHESIS
TECHNIQUE: Bilateral screening digital craniocaudal and mediolateral oblique
mammograms were obtained. Bilateral screening digital breast
tomosynthesis was performed. The images were evaluated with
computer-aided detection. Standard and/or implant displaced views
were performed.

[R MLO]
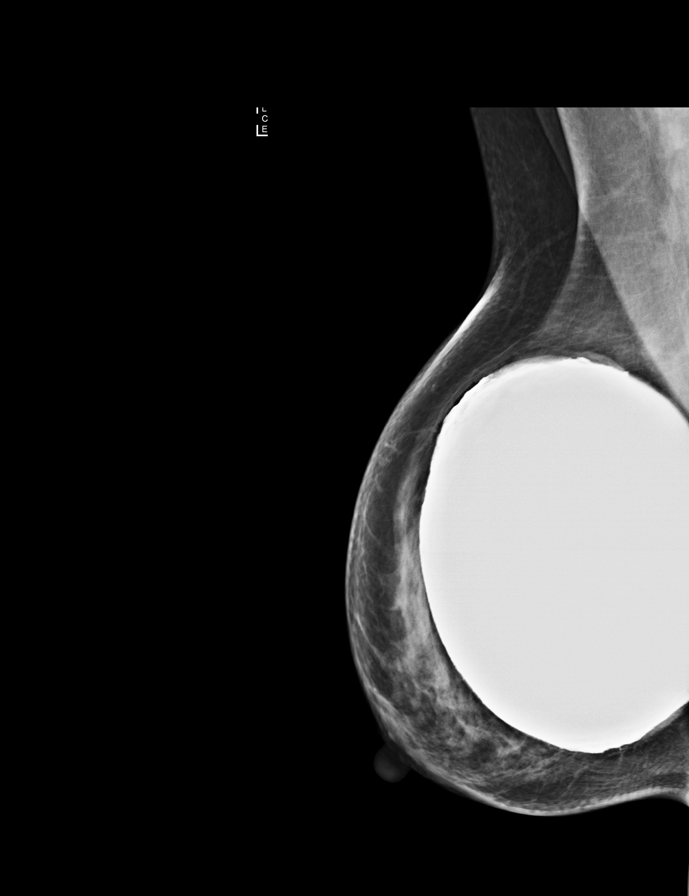

[L CC]
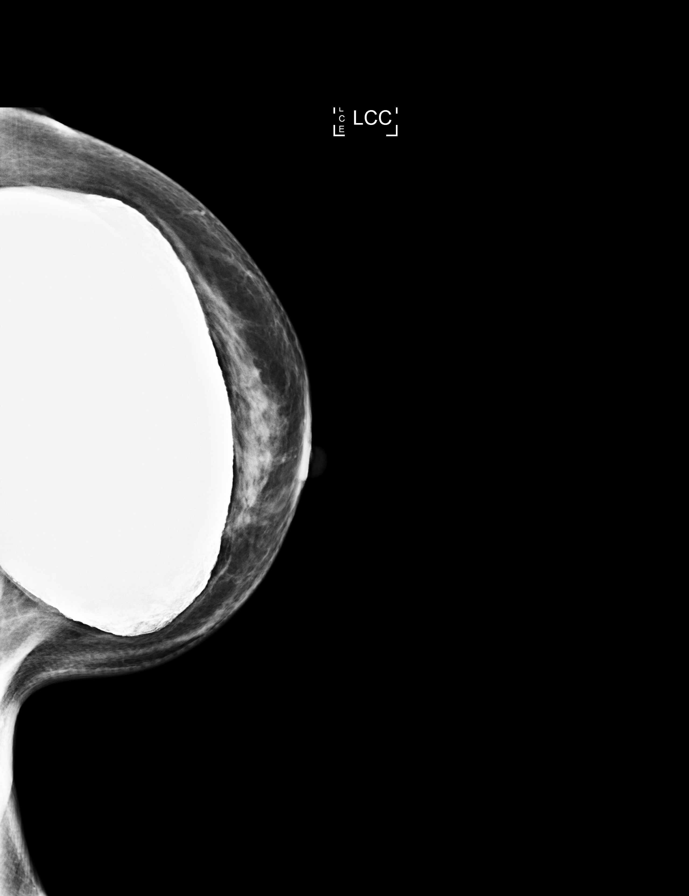

[R CC]
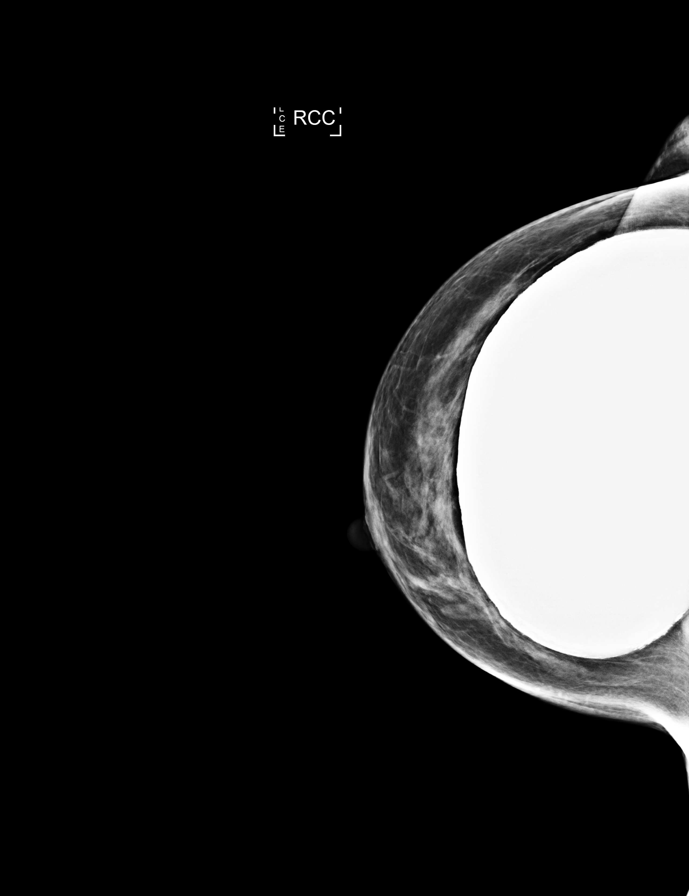

[L MLO]
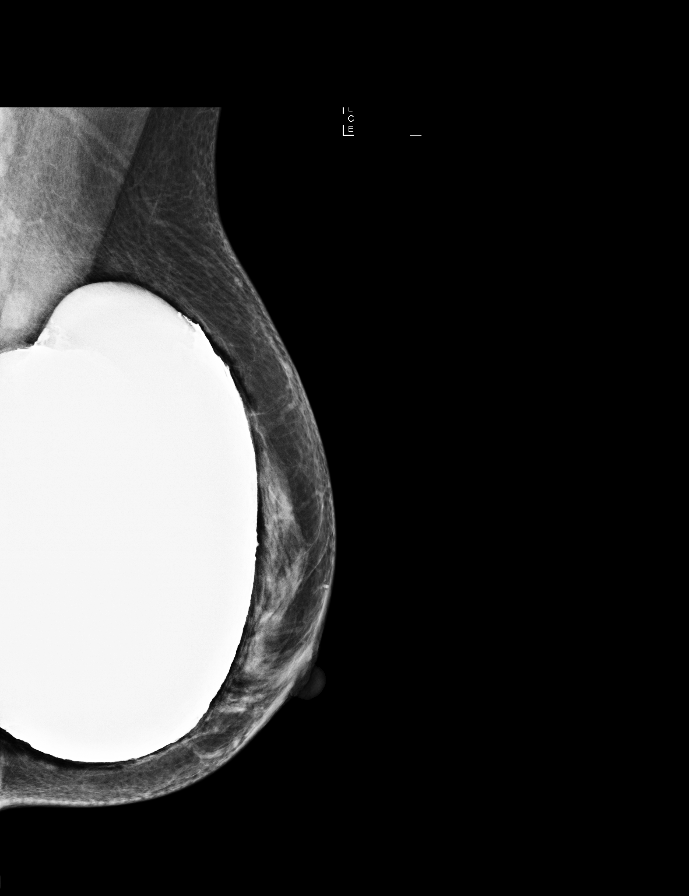

[R MLO synth-2D]
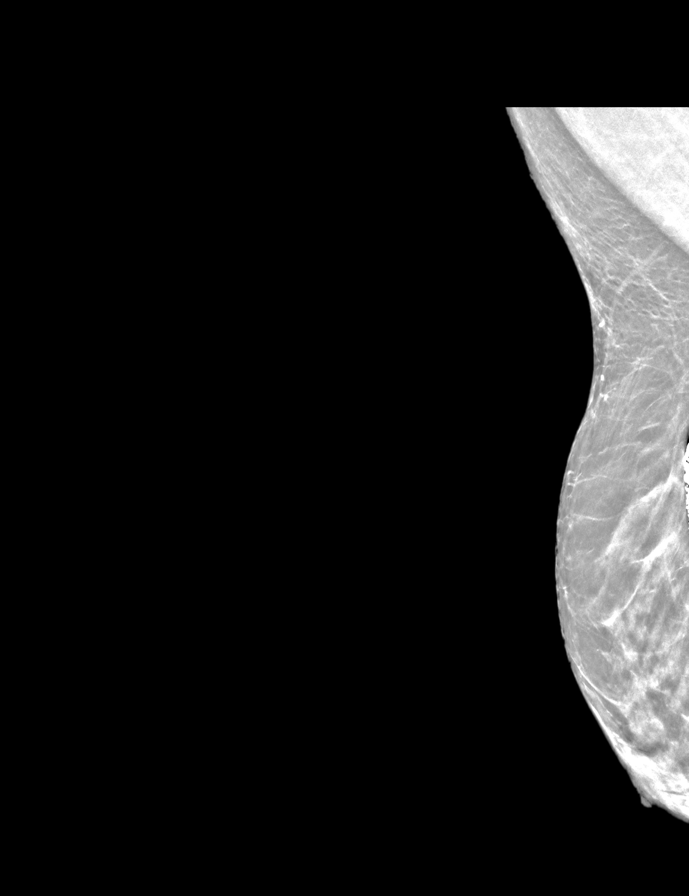

[R CC synth-2D]
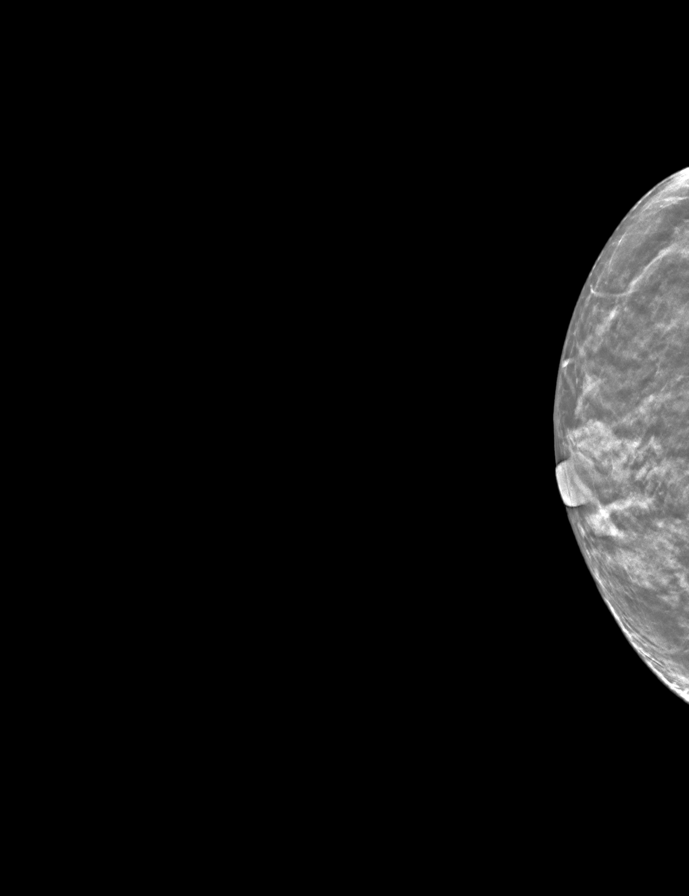

[L MLO synth-2D]
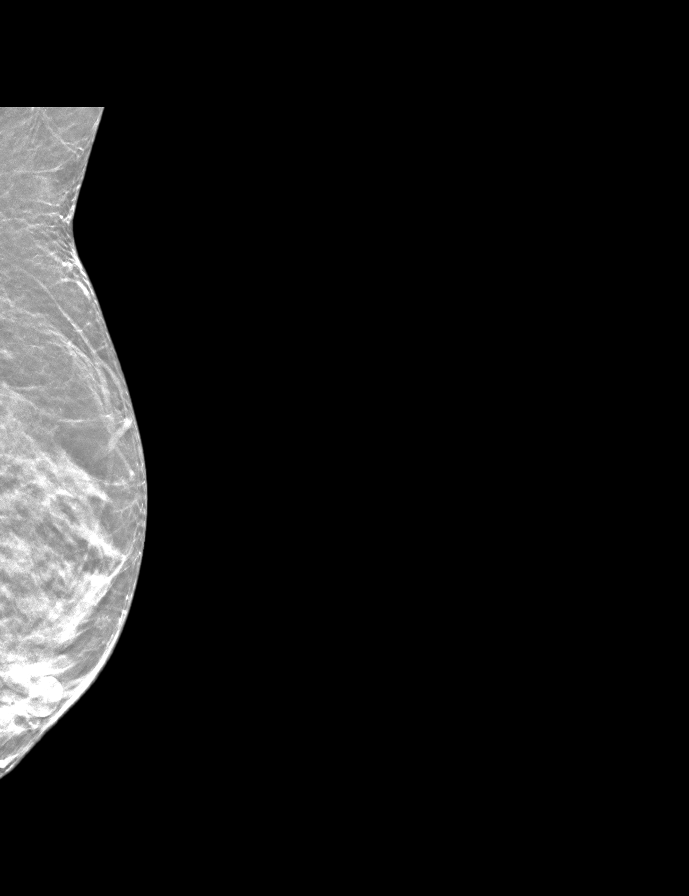

[L CC synth-2D]
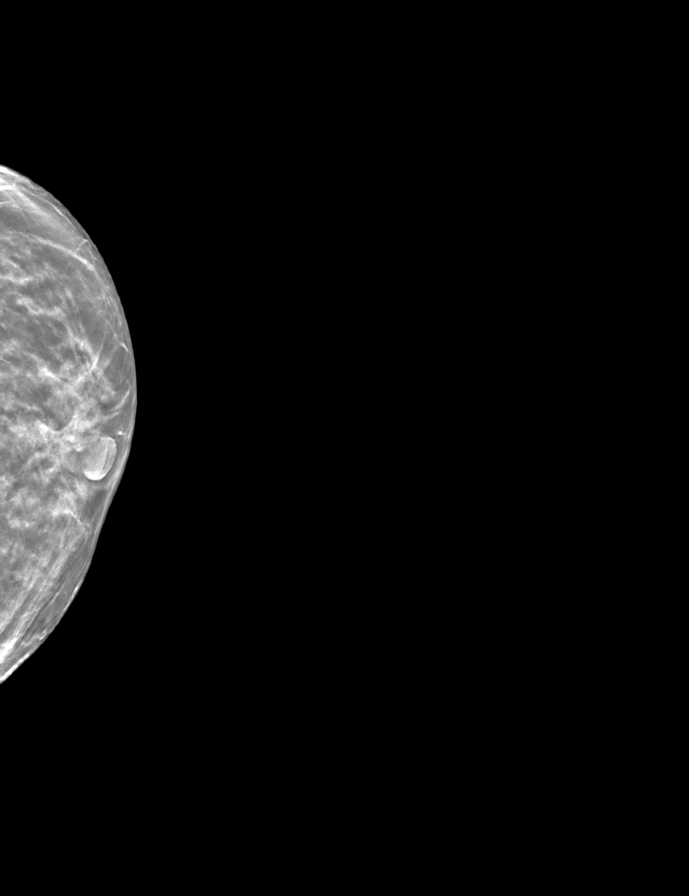

[L CCID BREAST TOMOSYNTHESIS IMAGE tomo · tomo slice 19/36.0]
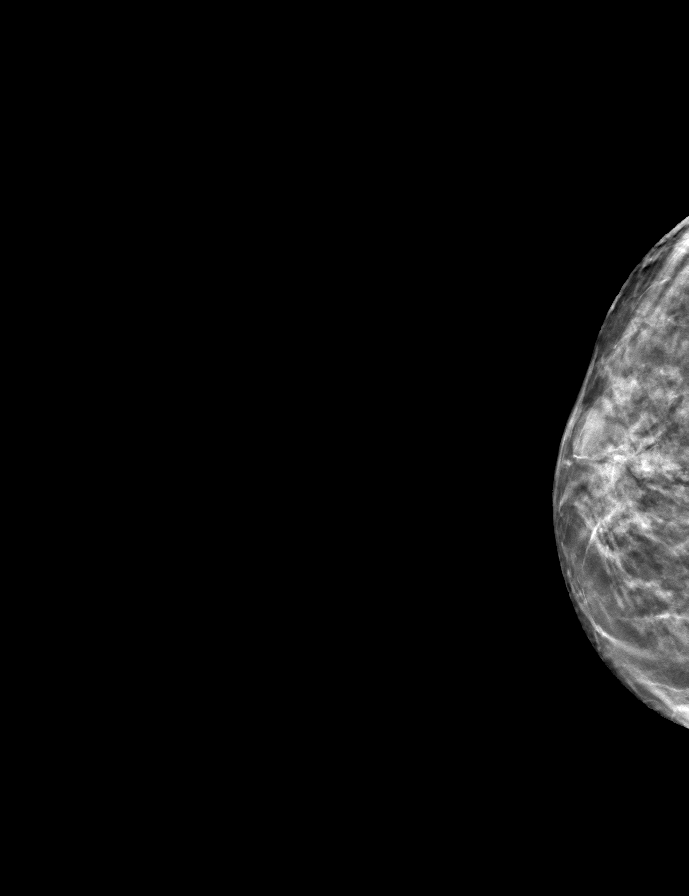

[9 of 28 positions shown; findings below may reference images not displayed]

ACR Breast Density Category c: The breast tissue is heterogeneously
dense, which may obscure small masses.
FINDINGS: The patient has retroglandular implants. There are no findings
suspicious for malignancy.
IMPRESSION: No mammographic evidence of malignancy. A result letter of this
screening mammogram will be mailed directly to the patient.

RECOMMENDATION:
Screening mammogram in one year. (Code:JF-[id number])

BI-RADS CATEGORY  1:  Negative.
